# Patient Record
Sex: Male | Born: 1980 | Race: White | Hispanic: No | Marital: Married | Smoking: Current every day smoker
Health system: Southern US, Community
[De-identification: ages and names within clinical notes are randomized; demographics above are authoritative.]

## PROBLEM LIST (undated history)

## (undated) DIAGNOSIS — Z8614 Personal history of Methicillin resistant Staphylococcus aureus infection: Secondary | ICD-10-CM

## (undated) DIAGNOSIS — S62609A Fracture of unspecified phalanx of unspecified finger, initial encounter for closed fracture: Secondary | ICD-10-CM

## (undated) HISTORY — PX: ESOPHAGUS SURGERY: SHX626

## (undated) HISTORY — PX: KNEE RECONSTRUCTION: SHX5883

---

## 2015-11-19 ENCOUNTER — Emergency Department (HOSPITAL_BASED_OUTPATIENT_CLINIC_OR_DEPARTMENT_OTHER)
Admission: EM | Admit: 2015-11-19 | Discharge: 2015-11-19 | Disposition: A | Discharge status: Home / Self Care | Payer: Self-pay | Attending: Emergency Medicine | Admitting: Emergency Medicine

## 2015-11-19 ENCOUNTER — Emergency Department (HOSPITAL_BASED_OUTPATIENT_CLINIC_OR_DEPARTMENT_OTHER): Payer: Self-pay

## 2015-11-19 ENCOUNTER — Encounter (HOSPITAL_BASED_OUTPATIENT_CLINIC_OR_DEPARTMENT_OTHER): Payer: Self-pay | Admitting: *Deleted

## 2015-11-19 DIAGNOSIS — S62621A Displaced fracture of medial phalanx of left index finger, initial encounter for closed fracture: Secondary | ICD-10-CM | POA: Insufficient documentation

## 2015-11-19 DIAGNOSIS — Y9289 Other specified places as the place of occurrence of the external cause: Secondary | ICD-10-CM | POA: Insufficient documentation

## 2015-11-19 DIAGNOSIS — M20002 Unspecified deformity of left finger(s): Secondary | ICD-10-CM | POA: Insufficient documentation

## 2015-11-19 DIAGNOSIS — W228XXA Striking against or struck by other objects, initial encounter: Secondary | ICD-10-CM | POA: Insufficient documentation

## 2015-11-19 DIAGNOSIS — S62609B Fracture of unspecified phalanx of unspecified finger, initial encounter for open fracture: Secondary | ICD-10-CM

## 2015-11-19 DIAGNOSIS — F17210 Nicotine dependence, cigarettes, uncomplicated: Secondary | ICD-10-CM | POA: Insufficient documentation

## 2015-11-19 DIAGNOSIS — Y990 Civilian activity done for income or pay: Secondary | ICD-10-CM | POA: Insufficient documentation

## 2015-11-19 DIAGNOSIS — Y9389 Activity, other specified: Secondary | ICD-10-CM | POA: Insufficient documentation

## 2015-11-19 DIAGNOSIS — S62609A Fracture of unspecified phalanx of unspecified finger, initial encounter for closed fracture: Secondary | ICD-10-CM

## 2015-11-19 HISTORY — DX: Fracture of unspecified phalanx of unspecified finger, initial encounter for closed fracture: S62.609A

## 2015-11-19 MED ORDER — TRAMADOL HCL 50 MG PO TABS: 100.0000 mg | ORAL_TABLET | Freq: Four times a day (QID) | ORAL | Status: DC | PRN

## 2015-11-19 MED ORDER — BUPIVACAINE HCL (PF) 0.5 % IJ SOLN
10.0000 mL | Freq: Once | INTRAMUSCULAR | Status: AC
  Administered 2015-11-19: 10 mL
  Filled 2015-11-19: qty 10

## 2015-11-19 MED ORDER — CEFAZOLIN SODIUM 1-5 GM-% IV SOLN
1.0000 g | Freq: Once | INTRAVENOUS | Status: AC
  Administered 2015-11-19: 1 g via INTRAVENOUS
  Filled 2015-11-19: qty 50

## 2015-11-19 MED ORDER — CEPHALEXIN 500 MG PO CAPS: 1000.0000 mg | ORAL_CAPSULE | Freq: Two times a day (BID) | ORAL | Status: AC

## 2015-11-19 MED ORDER — MORPHINE SULFATE (PF) 4 MG/ML IV SOLN
4.0000 mg | Freq: Once | INTRAVENOUS | Status: AC
  Administered 2015-11-19: 4 mg via INTRAVENOUS
  Filled 2015-11-19: qty 1

## 2015-11-19 MED ORDER — IBUPROFEN 800 MG PO TABS: 800.0000 mg | ORAL_TABLET | Freq: Three times a day (TID) | ORAL | Status: AC

## 2015-11-19 NOTE — ED Notes (Signed)
[facility]

## 2015-11-19 NOTE — ED Notes (Signed)
Patient transported to xray dept. 

## 2015-11-19 NOTE — ED Notes (Signed)
Mash injury to his left 2nd digit. 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 Laceration noted. Bleeding controlled.

## 2015-11-19 NOTE — Discharge Instructions (Signed)
Finger Fracture Your finger fracture also has an open wound. Keep your dressing in place and your splint in place until you are seen by the hand surgeon for recheck. Call tomorrow morning to schedule a recheck within the next one to 4 days. Keep your hand elevated as much as possible to decrease swelling. Fractures of fingers are breaks in the bones of the fingers. There are many types of fractures. There are different ways of treating these fractures. Your health care provider will discuss the best way to treat your fracture. CAUSES Traumatic injury is the main cause of broken fingers. These include:  Injuries while playing sports.  Workplace injuries.  Falls. RISK FACTORS Activities that can increase your risk of finger fractures include:  Sports.  Workplace activities that involve machinery.  A condition called osteoporosis, which can make your bones less dense and cause them to fracture more easily. SIGNS AND SYMPTOMS The main symptoms of a broken finger are pain and swelling within 15 minutes after the injury. Other symptoms include:  Bruising of your finger.  Stiffness of your finger.  Numbness of your finger.  Exposed bones (compound fracture) if the fracture is severe. DIAGNOSIS  The best way to diagnose a broken bone is with X-ray imaging. Additionally, your health care provider will use this X-ray image to evaluate the position of the broken finger bones.  TREATMENT  Finger fractures can be treated with:   Nonreduction--This means the bones are in place. The finger is splinted without changing the positions of the bone pieces. The splint is usually left on for about a week to 10 days. This will depend on your fracture and what your health care provider thinks.  Closed reduction--The bones are put back into position without using surgery. The finger is then splinted.  Open reduction and internal fixation--The fracture site is opened. Then the bone pieces are fixed into  place with pins or some type of hardware. This is seldom required. It depends on the severity of the fracture. HOME CARE INSTRUCTIONS   Follow your health care provider's instructions regarding activities, exercises, and physical therapy.  Only take over-the-counter or prescription medicines for pain, discomfort, or fever as directed by your health care provider. SEEK MEDICAL CARE IF: You have pain or swelling that limits the motion or use of your fingers. SEEK IMMEDIATE MEDICAL CARE IF:  Your finger becomes numb. MAKE SURE YOU:   Understand these instructions.  Will watch your condition.  Will get help right away if you are not doing well or get worse.   This information is not intended to replace advice given to you by your health care provider. Make sure you discuss any questions you have with your health care provider.   Document Released: 03/05/2001 Document Revised: 09/11/2013 Document Reviewed: 07/03/2013 Elsevier Interactive Patient Education 2016 [employer] Laceration Care, Adult A laceration is a cut that goes through all of the layers of the skin and into the tissue that is right under the skin. Some lacerations heal on their own. Others need to be closed with stitches (sutures), staples, skin adhesive strips, or skin glue. Proper laceration care minimizes the risk of infection and helps the laceration to heal better. HOW TO CARE FOR YOUR LACERATION If sutures or staples were used:  Keep the wound clean and dry.  If you were given a bandage (dressing), you should change it at least one time per day or as told by your health care provider. You should also change  it if it becomes wet or dirty.  Keep the wound completely dry for the first 24 hours or as told by your health care provider. After that time, you may shower or bathe. However, make sure that the wound is not soaked in water until after the sutures or staples have been removed.  Clean the wound one time each day  or as told by your health care provider:  Wash the wound with soap and water.  Rinse the wound with water to remove all soap.  Pat the wound dry with a clean towel. Do not rub the wound.  After cleaning the wound, apply a thin layer of antibiotic ointmentas told by your health care provider. This will help to prevent infection and keep the dressing from sticking to the wound.  Have the sutures or staples removed as told by your health care provider. If skin adhesive strips were used:  Keep the wound clean and dry.  If you were given a bandage (dressing), you should change it at least one time per day or as told by your health care provider. You should also change it if it becomes dirty or wet.  Do not get the skin adhesive strips wet. You may shower or bathe, but be careful to keep the wound dry.  If the wound gets wet, pat it dry with a clean towel. Do not rub the wound.  Skin adhesive strips fall off on their own. You may trim the strips as the wound heals. Do not remove skin adhesive strips that are still stuck to the wound. They will fall off in time. If skin glue was used:  Try to keep the wound dry, but you may briefly wet it in the shower or bath. Do not soak the wound in water, such as by swimming.  After you have showered or bathed, gently pat the wound dry with a clean towel. Do not rub the wound.  Do not do any activities that will make you sweat heavily until the skin glue has fallen off on its own.  Do not apply liquid, cream, or ointment medicine to the wound while the skin glue is in place. Using those may loosen the film before the wound has healed.  If you were given a bandage (dressing), you should change it at least one time per day or as told by your health care provider. You should also change it if it becomes dirty or wet.  If a dressing is placed over the wound, be careful not to apply tape directly over the skin glue. Doing that may cause the glue to be  pulled off before the wound has healed.  Do not pick at the glue. The skin glue usually remains in place for 5-10 days, then it falls off of the skin. General Instructions  Take over-the-counter and prescription medicines only as told by your health care provider.  If you were prescribed an antibiotic medicine or ointment, take or apply it as told by your doctor. Do not stop using it even if your condition improves.  To help prevent scarring, make sure to cover your wound with sunscreen whenever you are outside after stitches are removed, after adhesive strips are removed, or when glue remains in place and the wound is healed. Make sure to wear a sunscreen of at least 30 SPF.  Do not scratch or pick at the wound.  Keep all follow-up visits as told by your health care provider. This is important.  Check  your wound every day for signs of infection. Watch for:  Redness, swelling, or pain.  Fluid, blood, or pus.  Raise (elevate) the injured area above the level of your heart while you are sitting or lying down, if possible. SEEK MEDICAL CARE IF:  You received a tetanus shot and you have swelling, severe pain, redness, or bleeding at the injection site.  You have a fever.  A wound that was closed breaks open.  You notice a bad smell coming from your wound or your dressing.  You notice something coming out of the wound, such as wood or glass.  Your pain is not controlled with medicine.  You have increased redness, swelling, or pain at the site of your wound.  You have fluid, blood, or pus coming from your wound.  You notice a change in the color of your skin near your wound.  You need to change the dressing frequently due to fluid, blood, or pus draining from the wound.  You develop a new rash.  You develop numbness around the wound. SEEK IMMEDIATE MEDICAL CARE IF:  You develop severe swelling around the wound.  Your pain suddenly increases and is severe.  You develop  painful lumps near the wound or on skin that is anywhere on your body.  You have a red streak going away from your wound.  The wound is on your hand or foot and you cannot properly move a finger or toe.  The wound is on your hand or foot and you notice that your fingers or toes look pale or bluish.   This information is not intended to replace advice given to you by your health care provider. Make sure you discuss any questions you have with your health care provider.   Document Released: 11/21/2005 Document Revised: 04/07/2015 Document Reviewed: 11/17/2014 Elsevier Interactive Patient Education [employer]

## 2015-11-19 NOTE — ED Provider Notes (Signed)
CSN: [id number]     Arrival date & time 11/19/15  1618 History   First MD Initiated Contact with Patient 11/19/15 1628     Chief Complaint  Patient presents with  . Finger Injury     (Consider location/radiation/quality/duration/timing/severity/associated sxs/prior Treatment) HPI Patient was working with another coworker trying to leverage a crowbar. The crowbar slipped and crushed his index finger on the left hand. No other associated injury. He reports there is a cut and it is very painful. He reports this difficult to move the finger. History reviewed. No pertinent past medical history. History reviewed. No pertinent past surgical history. No family history on file. Social History  Substance Use Topics  . Smoking status: Current Every Day Smoker -- 1.00 packs/day    Types: Cigarettes  . Smokeless tobacco: None  . Alcohol Use: No    Review of Systems Constitutional: No recent fevers or chills.   Allergies  Review of patient's allergies indicates no known allergies.  Home Medications   Prior to Admission medications   Medication Sig Start Date End Date Taking? Authorizing Provider  cephALEXin (KEFLEX) 500 MG capsule Take 2 capsules (1,000 mg total) by mouth 2 (two) times daily. 11/19/15   Arby BarretteMarcy Brendi Mccarroll, MD  ibuprofen (ADVIL,MOTRIN) 800 MG tablet Take 1 tablet (800 mg total) by mouth 3 (three) times daily. 11/19/15   Arby BarretteMarcy Wallis Spizzirri, MD  traMADol (ULTRAM) 50 MG tablet Take 2 tablets (100 mg total) by mouth every 6 (six) hours as needed. 11/19/15   Arby BarretteMarcy Jina Olenick, MD   BP 135/97 mmHg  Pulse 88  Temp(Src) 98.7 F (37.1 C) (Oral)  Resp 20  Ht 5\' 11"  (1.803 m)  Wt 175 lb (79.379 kg)  BMI 24.42 kg/m2  SpO2 100% Physical Exam  Constitutional: He is oriented to person, place, and time. He appears well-developed and well-nourished. No distress.  HENT:  Head: Normocephalic and atraumatic.  Eyes: EOM are normal.  Pulmonary/Chest: Effort normal.  Musculoskeletal:  Left  index finger has laceration 1.5 cm linear over the dorsal surface just superior to the PIP. No active bleeding. Moderate diffuse swelling of the digit. Patient has pre-existing atrophic retraction of the distal phalangeal segment of both the index and the third digit. There is crepitus to movement of the index finger. Remainder of the hand is normal.  Neurological: He is alert and oriented to person, place, and time. Coordination normal.  Skin: Skin is warm and dry.  Psychiatric: He has a normal mood and affect.    ED Course  Procedures (including critical care time) LACERATION REPAIR Performed by: Arby BarrettePfeiffer, Jazyiah Yiu Authorized by: Arby BarrettePfeiffer, Aidric Endicott Consent: Verbal consent obtained. Risks and benefits: risks, benefits and alternatives were discussed Consent given by: patient Patient identity confirmed: provided demographic data Prepped and Draped in normal sterile fashion Wound explored  Laceration Location: Dorsum of first index finger. Just distal to the interphalangeal joint.  Laceration Length: 1.5 cm  No Foreign Bodies seen or palpated after anesthesia, the wound was extensively cleaned with normal saline. I retracted the wound and palpated with a forceps and was not able to visualize bone or contact bone with a forceps.  Anesthesia: Digital block with 0.5% Marcaine.     Anesthetic total: 3 ml  Irrigation method: syringe Amount of cleaning: standard  Skin closure: 4 interrupted 4-0 Ethilon suture   Number of sutures: 4   Technique: Interrupted   Patient tolerance: Patient tolerated the procedure well with no immediate complications. Labs Review Labs Reviewed - No data to  display  Imaging Review Dg Finger Index Left  11/19/2015  CLINICAL DATA:  Smashed LEFT index finger between machines today, pain with open wound at distal phalanx none EXAM: LEFT INDEX FINGER 2+V COMPARISON:  None. FINDINGS: Osseous mineralization normal. Joint spaces preserved. 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 Comminuted minimally displaced fracture at base of middle phalanx LEFT index finger with questionable intra-articular extension at the PIP joint. No additional fracture, dislocation or bone destruction. IMPRESSION: Comminuted displaced fracture through base of middle phalanx LEFT index finger with questionable intra-articular extension at PIP joint. 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 Electronically Signed   By: Ulyses Southward M.D.   On: 11/19/2015 17:10   I have personally reviewed and evaluated these images and lab results as part of my medical decision-making.   EKG Interpretation None     Consult: Patient's case was reviewed with Dr. Merlyn Lot. At this time with no bone visible and no bone palpable in the laceration, he advises the wound can be closed and the patient can follow-up as an outpatient for definitive management. MDM   Final diagnoses:  Finger fracture, left, open, initial encounter   Patient has fracture of the middle phalangeal segment of the third digit. There is associated laceration. No bone was visible or palpable during wound preparation and closure. Wound was extensively cleaned with normal saline and surrounding prepped with Betadine. Some traction was applied to straighten the bony segment. Once wound was closed, this was dressed and buddy taped with splint applied. Patient will be discharged with Keflex, ibuprofen and tramadol. He is counseled on the necessity of follow-up with the hand surgeon.    Arby Barrette, MD 11/19/15 [phone]

## 2015-11-23 ENCOUNTER — Encounter (HOSPITAL_BASED_OUTPATIENT_CLINIC_OR_DEPARTMENT_OTHER): Payer: Self-pay | Admitting: *Deleted

## 2015-11-23 ENCOUNTER — Other Ambulatory Visit: Payer: Self-pay | Admitting: Orthopedic Surgery

## 2015-11-24 ENCOUNTER — Ambulatory Visit (HOSPITAL_BASED_OUTPATIENT_CLINIC_OR_DEPARTMENT_OTHER): Payer: Worker's Compensation | Admitting: Anesthesiology

## 2015-11-24 ENCOUNTER — Encounter (HOSPITAL_BASED_OUTPATIENT_CLINIC_OR_DEPARTMENT_OTHER)
Admission: RE | Disposition: A | Discharge status: Home / Self Care | Payer: Self-pay | Source: Ambulatory Visit | Attending: Orthopedic Surgery

## 2015-11-24 ENCOUNTER — Ambulatory Visit (HOSPITAL_BASED_OUTPATIENT_CLINIC_OR_DEPARTMENT_OTHER)
Admission: RE | Admit: 2015-11-24 | Discharge: 2015-11-24 | Disposition: A | Discharge status: Home / Self Care | Payer: Worker's Compensation | Source: Ambulatory Visit | Attending: Orthopedic Surgery | Admitting: Orthopedic Surgery

## 2015-11-24 ENCOUNTER — Encounter (HOSPITAL_BASED_OUTPATIENT_CLINIC_OR_DEPARTMENT_OTHER): Payer: Self-pay | Admitting: Anesthesiology

## 2015-11-24 DIAGNOSIS — S62621A Displaced fracture of medial phalanx of left index finger, initial encounter for closed fracture: Secondary | ICD-10-CM | POA: Insufficient documentation

## 2015-11-24 DIAGNOSIS — W230XXA Caught, crushed, jammed, or pinched between moving objects, initial encounter: Secondary | ICD-10-CM | POA: Insufficient documentation

## 2015-11-24 DIAGNOSIS — F17210 Nicotine dependence, cigarettes, uncomplicated: Secondary | ICD-10-CM | POA: Diagnosis not present

## 2015-11-24 DIAGNOSIS — S67191A Crushing injury of left index finger, initial encounter: Secondary | ICD-10-CM | POA: Diagnosis not present

## 2015-11-24 HISTORY — PX: CLOSED REDUCTION METACARPAL WITH PERCUTANEOUS PINNING: SHX5613

## 2015-11-24 HISTORY — DX: Personal history of Methicillin resistant Staphylococcus aureus infection: Z86.14

## 2015-11-24 HISTORY — DX: Fracture of unspecified phalanx of unspecified finger, initial encounter for closed fracture: S62.609A

## 2015-11-24 SURGERY — CLOSED REDUCTION, FRACTURE, METACARPAL BONE, WITH PERCUTANEOUS PINNING
Anesthesia: General | Site: Finger | Laterality: Left

## 2015-11-24 MED ORDER — CHLORHEXIDINE GLUCONATE 4 % EX LIQD: 60.0000 mL | Freq: Once | CUTANEOUS | Status: DC

## 2015-11-24 MED ORDER — HYDROMORPHONE HCL 1 MG/ML IJ SOLN
0.2500 mg | INTRAMUSCULAR | Status: DC | PRN
  Administered 2015-11-24 (×4): 0.5 mg via INTRAVENOUS

## 2015-11-24 MED ORDER — LACTATED RINGERS IV SOLN
INTRAVENOUS | Status: DC
  Administered 2015-11-24 (×2): via INTRAVENOUS

## 2015-11-24 MED ORDER — ONDANSETRON HCL 4 MG/2ML IJ SOLN
INTRAMUSCULAR | Status: DC | PRN
  Administered 2015-11-24: 4 mg via INTRAVENOUS

## 2015-11-24 MED ORDER — OXYCODONE HCL 5 MG PO TABS
5.0000 mg | ORAL_TABLET | Freq: Once | ORAL | Status: AC | PRN
  Administered 2015-11-24: 5 mg via ORAL

## 2015-11-24 MED ORDER — MEPERIDINE HCL 25 MG/ML IJ SOLN: 6.2500 mg | INTRAMUSCULAR | Status: DC | PRN

## 2015-11-24 MED ORDER — ONDANSETRON HCL 4 MG/2ML IJ SOLN
INTRAMUSCULAR | Status: AC
  Filled 2015-11-24: qty 2

## 2015-11-24 MED ORDER — OXYCODONE HCL 5 MG PO TABS
ORAL_TABLET | ORAL | Status: AC
  Filled 2015-11-24: qty 1

## 2015-11-24 MED ORDER — KETOROLAC TROMETHAMINE 30 MG/ML IJ SOLN
30.0000 mg | Freq: Once | INTRAMUSCULAR | Status: AC
  Administered 2015-11-24: 30 mg via INTRAVENOUS

## 2015-11-24 MED ORDER — ATROPINE SULFATE 0.4 MG/ML IJ SOLN
INTRAMUSCULAR | Status: AC
  Filled 2015-11-24: qty 1

## 2015-11-24 MED ORDER — OXYCODONE-ACETAMINOPHEN 5-325 MG PO TABS: ORAL_TABLET | ORAL | Status: AC

## 2015-11-24 MED ORDER — HYDROMORPHONE HCL 1 MG/ML IJ SOLN
INTRAMUSCULAR | Status: AC
  Filled 2015-11-24: qty 1

## 2015-11-24 MED ORDER — FENTANYL CITRATE (PF) 100 MCG/2ML IJ SOLN
50.0000 ug | INTRAMUSCULAR | Status: DC | PRN
Start: 2015-11-24 — End: 2015-11-24
  Administered 2015-11-24: 100 ug via INTRAVENOUS

## 2015-11-24 MED ORDER — EPHEDRINE SULFATE 50 MG/ML IJ SOLN
INTRAMUSCULAR | Status: AC
  Filled 2015-11-24: qty 1

## 2015-11-24 MED ORDER — LIDOCAINE HCL (CARDIAC) 20 MG/ML IV SOLN
INTRAVENOUS | Status: AC
  Filled 2015-11-24: qty 5

## 2015-11-24 MED ORDER — SCOPOLAMINE 1 MG/3DAYS TD PT72: 1.0000 | MEDICATED_PATCH | Freq: Once | TRANSDERMAL | Status: DC | PRN

## 2015-11-24 MED ORDER — PROPOFOL 10 MG/ML IV BOLUS
INTRAVENOUS | Status: AC
  Filled 2015-11-24: qty 40

## 2015-11-24 MED ORDER — BUPIVACAINE HCL (PF) 0.25 % IJ SOLN
INTRAMUSCULAR | Status: DC | PRN
  Administered 2015-11-24: 10 mL

## 2015-11-24 MED ORDER — SUCCINYLCHOLINE CHLORIDE 20 MG/ML IJ SOLN
INTRAMUSCULAR | Status: AC
  Filled 2015-11-24: qty 1

## 2015-11-24 MED ORDER — KETOROLAC TROMETHAMINE 30 MG/ML IJ SOLN
INTRAMUSCULAR | Status: AC
  Filled 2015-11-24: qty 1

## 2015-11-24 MED ORDER — MIDAZOLAM HCL 2 MG/2ML IJ SOLN
INTRAMUSCULAR | Status: AC
  Filled 2015-11-24: qty 2

## 2015-11-24 MED ORDER — GLYCOPYRROLATE 0.2 MG/ML IJ SOLN: 0.2000 mg | Freq: Once | INTRAMUSCULAR | Status: DC | PRN

## 2015-11-24 MED ORDER — GLYCOPYRROLATE 0.2 MG/ML IJ SOLN
INTRAMUSCULAR | Status: AC
  Filled 2015-11-24: qty 1

## 2015-11-24 MED ORDER — MIDAZOLAM HCL 2 MG/2ML IJ SOLN
1.0000 mg | INTRAMUSCULAR | Status: DC | PRN
  Administered 2015-11-24: 2 mg via INTRAVENOUS

## 2015-11-24 MED ORDER — FENTANYL CITRATE (PF) 100 MCG/2ML IJ SOLN
INTRAMUSCULAR | Status: AC
  Filled 2015-11-24: qty 2

## 2015-11-24 MED ORDER — DEXAMETHASONE SODIUM PHOSPHATE 10 MG/ML IJ SOLN
INTRAMUSCULAR | Status: AC
  Filled 2015-11-24: qty 1

## 2015-11-24 MED ORDER — LIDOCAINE HCL (CARDIAC) 20 MG/ML IV SOLN
INTRAVENOUS | Status: DC | PRN
  Administered 2015-11-24: 100 mg via INTRAVENOUS

## 2015-11-24 MED ORDER — OXYCODONE HCL 5 MG/5ML PO SOLN: 5.0000 mg | Freq: Once | ORAL | Status: AC | PRN

## 2015-11-24 MED ORDER — PHENYLEPHRINE HCL 10 MG/ML IJ SOLN
INTRAMUSCULAR | Status: AC
  Filled 2015-11-24: qty 1

## 2015-11-24 MED ORDER — CEFAZOLIN SODIUM-DEXTROSE 2-3 GM-% IV SOLR
2.0000 g | INTRAVENOUS | Status: AC
  Administered 2015-11-24: 2 g via INTRAVENOUS

## 2015-11-24 MED ORDER — PROPOFOL 10 MG/ML IV BOLUS
INTRAVENOUS | Status: DC | PRN
  Administered 2015-11-24: 200 mg via INTRAVENOUS

## 2015-11-24 MED ORDER — CEFAZOLIN SODIUM-DEXTROSE 2-3 GM-% IV SOLR
INTRAVENOUS | Status: AC
  Filled 2015-11-24: qty 50

## 2015-11-24 MED ORDER — BUPIVACAINE HCL (PF) 0.25 % IJ SOLN
INTRAMUSCULAR | Status: AC
  Filled 2015-11-24: qty 30

## 2015-11-24 MED ORDER — DEXAMETHASONE SODIUM PHOSPHATE 10 MG/ML IJ SOLN
INTRAMUSCULAR | Status: DC | PRN
  Administered 2015-11-24: 10 mg via INTRAVENOUS

## 2015-11-24 SURGICAL SUPPLY — 58 items
BANDAGE ELASTIC 3 VELCRO ST LF (GAUZE/BANDAGES/DRESSINGS) ×3 IMPLANT
BLADE MINI RND TIP GREEN BEAV (BLADE) IMPLANT
BLADE SURG 15 STRL LF DISP TIS (BLADE) ×2 IMPLANT
BLADE SURG 15 STRL SS (BLADE) ×4
BNDG ELASTIC 2 VLCR STRL LF (GAUZE/BANDAGES/DRESSINGS) IMPLANT
BNDG ESMARK 4X9 LF (GAUZE/BANDAGES/DRESSINGS) ×3 IMPLANT
BNDG GAUZE ELAST 4 BULKY (GAUZE/BANDAGES/DRESSINGS) ×3 IMPLANT
CHLORAPREP W/TINT 26ML (MISCELLANEOUS) ×3 IMPLANT
CORDS BIPOLAR (ELECTRODE) ×3 IMPLANT
COVER BACK TABLE 60X90IN (DRAPES) ×3 IMPLANT
COVER MAYO STAND STRL (DRAPES) ×3 IMPLANT
CUFF TOURNIQUET SINGLE 18IN (TOURNIQUET CUFF) ×3 IMPLANT
DRAPE EXTREMITY T 121X128X90 (DRAPE) ×3 IMPLANT
DRAPE OEC MINIVIEW 54X84 (DRAPES) ×3 IMPLANT
DRAPE SURG 17X23 STRL (DRAPES) ×3 IMPLANT
GAUZE SPONGE 4X4 12PLY STRL (GAUZE/BANDAGES/DRESSINGS) ×3 IMPLANT
GAUZE XEROFORM 1X8 LF (GAUZE/BANDAGES/DRESSINGS) ×3 IMPLANT
GLOVE BIO SURGEON STRL SZ7.5 (GLOVE) ×3 IMPLANT
GLOVE BIOGEL PI IND STRL 7.0 (GLOVE) ×1 IMPLANT
GLOVE BIOGEL PI IND STRL 8 (GLOVE) ×1 IMPLANT
GLOVE BIOGEL PI IND STRL 8.5 (GLOVE) ×1 IMPLANT
GLOVE BIOGEL PI INDICATOR 7.0 (GLOVE) ×2
GLOVE BIOGEL PI INDICATOR 8 (GLOVE) ×2
GLOVE BIOGEL PI INDICATOR 8.5 (GLOVE) ×2
GLOVE EXAM NITRILE EXT CUFF MD (GLOVE) ×3 IMPLANT
GLOVE SURG ORTHO 8.0 STRL STRW (GLOVE) ×3 IMPLANT
GLOVE SURG SS PI 6.5 STRL IVOR (GLOVE) ×3 IMPLANT
GOWN STRL REUS W/ TWL LRG LVL3 (GOWN DISPOSABLE) ×1 IMPLANT
GOWN STRL REUS W/ TWL XL LVL3 (GOWN DISPOSABLE) IMPLANT
GOWN STRL REUS W/TWL LRG LVL3 (GOWN DISPOSABLE) ×2
GOWN STRL REUS W/TWL XL LVL3 (GOWN DISPOSABLE) ×6 IMPLANT
K-WIRE .035X4 (WIRE) ×3 IMPLANT
NEEDLE HYPO 22GX1.5 SAFETY (NEEDLE) IMPLANT
NEEDLE HYPO 25X1 1.5 SAFETY (NEEDLE) ×3 IMPLANT
NS IRRIG 1000ML POUR BTL (IV SOLUTION) ×3 IMPLANT
PACK BASIN DAY SURGERY FS (CUSTOM PROCEDURE TRAY) ×3 IMPLANT
PAD CAST 3X4 CTTN HI CHSV (CAST SUPPLIES) ×1 IMPLANT
PAD CAST 4YDX4 CTTN HI CHSV (CAST SUPPLIES) ×1 IMPLANT
PADDING CAST ABS 4INX4YD NS (CAST SUPPLIES)
PADDING CAST ABS COTTON 4X4 ST (CAST SUPPLIES) IMPLANT
PADDING CAST COTTON 3X4 STRL (CAST SUPPLIES) ×2
PADDING CAST COTTON 4X4 STRL (CAST SUPPLIES) ×2
SLEEVE SCD COMPRESS KNEE MED (MISCELLANEOUS) IMPLANT
SPLINT PLASTER CAST XFAST 3X15 (CAST SUPPLIES) ×10 IMPLANT
SPLINT PLASTER CAST XFAST 4X15 (CAST SUPPLIES) IMPLANT
SPLINT PLASTER XTRA FAST SET 4 (CAST SUPPLIES)
SPLINT PLASTER XTRA FASTSET 3X (CAST SUPPLIES) ×20
STOCKINETTE 4X48 STRL (DRAPES) ×3 IMPLANT
SUT ETHILON 3 0 PS 1 (SUTURE) IMPLANT
SUT ETHILON 4 0 PS 2 18 (SUTURE) ×3 IMPLANT
SUT MERSILENE 4 0 P 3 (SUTURE) IMPLANT
SUT VIC AB 3-0 PS1 18 (SUTURE)
SUT VIC AB 3-0 PS1 18XBRD (SUTURE) IMPLANT
SUT VICRYL 4-0 PS2 18IN ABS (SUTURE) IMPLANT
SYR BULB 3OZ (MISCELLANEOUS) ×3 IMPLANT
SYR CONTROL 10ML LL (SYRINGE) ×3 IMPLANT
TOWEL OR 17X24 6PK STRL BLUE (TOWEL DISPOSABLE) ×6 IMPLANT
UNDERPAD 30X30 (UNDERPADS AND DIAPERS) ×3 IMPLANT

## 2015-11-24 NOTE — Anesthesia Preprocedure Evaluation (Addendum)
Anesthesia Evaluation  Patient identified by MRN, date of birth, ID band Patient awake    Reviewed: Allergy & Precautions, NPO status , Patient's Chart, lab work & pertinent test results  Airway Mallampati: I  TM Distance: >3 FB Neck ROM: Full    Dental  (+) Teeth Intact, Dental Advisory Given   Pulmonary Current Smoker,  breath sounds clear to auscultation        Cardiovascular Rhythm:Regular Rate:Normal     Neuro/Psych    GI/Hepatic   Endo/Other    Renal/GU      Musculoskeletal   Abdominal   Peds  Hematology   Anesthesia Other Findings   Reproductive/Obstetrics                             Anesthesia Physical Anesthesia Plan  ASA: I  Anesthesia Plan: General   Post-op Pain Management:    Induction: Intravenous  Airway Management Planned: LMA  Additional Equipment:   Intra-op Plan:   Post-operative Plan: Extubation in OR  Informed Consent: I have reviewed the patients History and Physical, chart, labs and discussed the procedure including the risks, benefits and alternatives for the proposed anesthesia with the patient or authorized representative who has indicated his/her understanding and acceptance.   Dental advisory given  Plan Discussed with: CRNA, Anesthesiologist and Surgeon  Anesthesia Plan Comments:         Anesthesia Quick Evaluation  

## 2015-11-24 NOTE — Transfer of Care (Signed)
Immediate Anesthesia Transfer of Care Note  Patient: Brett Ellis  Procedure(s) Performed: Procedure(s): CLOSED REDUCTION PERCUTANEOUS PINNING LEFT INDEX FINGER (Left)  Patient Location: PACU  Anesthesia Type:General  Level of Consciousness: awake  Airway & Oxygen Therapy: Patient Spontanous Breathing and Patient connected to face mask oxygen  Post-op Assessment: Report given to RN and Post -op Vital signs reviewed and stable  Post vital signs: Reviewed and stable  Last Vitals:  Filed Vitals:   11/24/15 1314  BP: 126/81  Pulse: 64  Temp: 36.5 C  Resp: 18    Complications: No apparent anesthesia complications

## 2015-11-24 NOTE — Op Note (Signed)
[id number]

## 2015-11-24 NOTE — Op Note (Signed)
Intra-operative fluoroscopic images in the AP, lateral, and oblique views were taken and evaluated by myself.  Reduction and hardware placement were confirmed.  There was no intraarticular penetration of permanent hardware.  

## 2015-11-24 NOTE — Anesthesia Procedure Notes (Signed)
Procedure Name: LMA Insertion Date/Time: 11/24/2015 3:03 PM Performed by: Caren MacadamARTER, Aryaa Bunting W Pre-anesthesia Checklist: Patient identified, Emergency Drugs available, Suction available and Patient being monitored Patient Re-evaluated:Patient Re-evaluated prior to inductionOxygen Delivery Method: Circle System Utilized Preoxygenation: Pre-oxygenation with 100% oxygen Intubation Type: IV induction Ventilation: Mask ventilation without difficulty LMA: LMA inserted LMA Size: 5.0 Number of attempts: 1 Airway Equipment and Method: Bite block Placement Confirmation: positive ETCO2 and breath sounds checked- equal and bilateral Tube secured with: Tape Dental Injury: Teeth and Oropharynx as per pre-operative assessment

## 2015-11-24 NOTE — H&P (Signed)
  Brett BombardJoshua Ellis is an 34 y.o. male.   Chief Complaint: left index finger fracture HPI: 34 yo male states he smashed left index finger between pry bar and track hoe last week.  [facility] where [facility]R revealed middle phalanx fracture.  Splinted and followed up in office.  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  Past Medical History  Diagnosis Date  . Fracture of phalanx of finger 11/19/2015    left index - crush injury; sutured 11/19/2015  . History of MRSA infection ~ 3 yrs. ago    right thigh    Past Surgical History  Procedure Laterality Date  . Knee reconstruction Left   . Esophagus surgery      age 39 weeks    History reviewed. No pertinent family history. Social History:  reports that he has been smoking Cigarettes.  He has a 17 pack-year smoking history. He has never used smokeless tobacco. He reports that he does not drink alcohol or use illicit drugs.  Allergies: No Known Allergies  No prescriptions prior to admission    No results found for this or any previous visit (from the past 48 hour(s)).  No results found.   A comprehensive review of systems was negative.  Height 5\' 11"  (1.803 m), weight 82.101 kg (181 lb).  General appearance: alert, cooperative and appears stated age Head: Normocephalic, without obvious abnormality, atraumatic Neck: supple, symmetrical, trachea midline Resp: clear to auscultation bilaterally Cardio: regular rate and rhythm GI: non tender Extremities: intact sensation adn capillary refill all digits. +epl/fpl/io.  wound on dorsum of middle phalanx with sutures in place. Pulses: 2+ and symmetric Skin: Skin color, texture, turgor normal. No rashes or lesions Neurologic: Grossly normal Incision/Wound: As above  Assessment/Plan Left index finger middle phalanx fracture.  Non operative and operative treatment options were discussed with the patient and patient wishes to proceed with  operative treatment. Risks, benefits, and alternatives of surgery were discussed and the patient agrees with the plan of care.   Brett Ellis R 11/24/2015, 12:59 PM

## 2015-11-24 NOTE — Brief Op Note (Signed)
11/24/2015  3:43 PM  PATIENT:  Brett Ellis  34 y.o. male  PRE-OPERATIVE DIAGNOSIS:  LEFT INDEX PHALANX FRACTURE   POST-OPERATIVE DIAGNOSIS:  LEFT INDEX PHALANX FRACTURE   PROCEDURE:  Procedure(s): CLOSED REDUCTION PERCUTANEOUS PINNING LEFT INDEX FINGER (Left)  SURGEON:  Surgeon(s) and Role:    * Brett LoaKevin Rokhaya Quinn, MD - Primary    * Brett SaltGary Elidia Bonenfant, MD - Assisting  PHYSICIAN ASSISTANT:   ASSISTANTS: Brett SaltGary Hilarie Sinha, MD   ANESTHESIA:   general  EBL:  Total I/O In: 1000 [I.V.:1000] Out: -   BLOOD ADMINISTERED:none  DRAINS: none   LOCAL MEDICATIONS USED:  MARCAINE     SPECIMEN:  No Specimen  DISPOSITION OF SPECIMEN:  N/A  COUNTS:  YES  TOURNIQUET:   Total Tourniquet Time Documented: Upper Arm (Left) - 20 minutes Total: Upper Arm (Left) - 20 minutes   DICTATION: .Other Dictation: Dictation Number [id number]  PLAN OF CARE: Discharge to home after PACU  PATIENT DISPOSITION:  PACU - hemodynamically stable.

## 2015-11-24 NOTE — Discharge Instructions (Addendum)

## 2015-11-24 NOTE — Anesthesia Postprocedure Evaluation (Signed)
Anesthesia Post Note  Patient: Brett Ellis  Procedure(s) Performed: Procedure(s) (LRB): CLOSED REDUCTION PERCUTANEOUS PINNING LEFT INDEX FINGER (Left)  Patient location during evaluation: PACU Anesthesia Type: General Level of consciousness: awake and alert Pain management: pain level controlled Vital Signs Assessment: post-procedure vital signs reviewed and stable Respiratory status: spontaneous breathing, nonlabored ventilation, respiratory function stable and patient connected to nasal cannula oxygen Cardiovascular status: blood pressure returned to baseline and stable Postop Assessment: no signs of nausea or vomiting Anesthetic complications: no    Last Vitals:  Filed Vitals:   11/24/15 1630 11/24/15 1654  BP: 99/67 122/72  Pulse: 56 65  Temp:  36.4 C  Resp: 13 18    Last Pain:  Filed Vitals:   11/24/15 1655  PainSc: 5                  [name] DAVID

## 2015-11-25 ENCOUNTER — Encounter (HOSPITAL_BASED_OUTPATIENT_CLINIC_OR_DEPARTMENT_OTHER): Payer: Self-pay | Admitting: Orthopedic Surgery

## 2015-11-25 NOTE — Op Note (Signed)
NAMMarland Kitchen:  Brett Ellis, Brett             ACCOUNT NO.:  [id number]  MEDICAL RECORD NO.:  [id number]  LOCATION:                               FACILITY:  [facility]  PHYSICIAN:  Betha LoaKevin Kanasia Gayman, MD        DATE OF BIRTH:  December 07, 1980  DATE OF PROCEDURE:  11/24/2015 DATE OF DISCHARGE:  11/24/2015                              OPERATIVE REPORT   PREOPERATIVE DIAGNOSIS:  Left index finger middle phalanx fracture.  POSTOPERATIVE DIAGNOSIS:  Left index finger middle phalanx fracture.  PROCEDURE:  Closed reduction percutaneous pinning left index finger middle phalanx fracture.  SURGEON:  Betha LoaKevin Jem Castro, MD  ASSISTANT:  [name], MD  ANESTHESIA:  General.  IV FLUIDS:  Per Anesthesia flow sheet.  ESTIMATED BLOOD LOSS:  Minimal.  COMPLICATIONS:  None.  SPECIMENS:  None.  TOURNIQUET TIME:  20 minutes.  DISPOSITION:  Stable to PACU.  INDICATIONS:  Brett Ellis is a 34 year old male who last week smashed his left index finger between a pry-bar and a track hoe.  [facility] where his wound was irrigated and debrided and sutured.  He was splinted and followed up in the office.  We discussed nonoperative and operative treatment options.  He elected to proceed with operative fixation.  Risks, benefits, and alternatives of surgery were discussed including risk of blood loss; infection; damage to nerves, vessels, tendons, ligaments, bone; failure of surgery; need for additional surgery; complications with wound healing, continued pain, nonunion, malunion, stiffness.  He voiced understanding of these risks and elected to proceed.  OPERATIVE COURSE:  After being identified preoperatively by myself, the patient and I agreed upon procedure and site of procedure.  Surgical site was marked.  The risks, benefits, alternatives of surgery were reviewed and he wished to proceed.  Surgical consent had been signed. He was given IV Ancef as preoperative antibiotic prophylaxis.  He  was transferred to the operating room and placed on the operating room table in supine position with left upper extremity on arm board.  General anesthesia was induced by anesthesiologist.  Left upper extremity was prepped and draped in normal sterile orthopedic fashion.  Surgical pause was performed between surgeons, anesthesia, and operating room staff, and all were in agreement as to patient, procedure, and site procedure. The sutures had been removed from the wound prior to prepping and draping.  Tourniquet at the proximal aspect of the extremity was inflated to 250 mmHg after exsanguination of the limb with Esmarch bandage.  The wound was explored.  There was no gross contamination.  It did not course deep to the extensor tendon.  This was not an open fracture.  The wound was copiously irrigated with sterile saline and later closed with 4-0 nylon in a horizontal mattress fashion.  C-arm was brought in.  It was used in AP and lateral projections throughout the case to aid in reduction and position of hardware.  A closed reduction of the middle phalanx was performed.  Two 0.035-inch K-wires were then advanced from the base of the phalanx in a crossed fashion into the distal aspect of the phalanx.  This provided good stabilization.  The pins were bent and cut  short.  The wound and pin sites were dressed with sterile Xeroform, 4x4s, and wrapped with Kerlix bandage.  A volar splint was placed and wrapped with Kerlix and Ace bandage.  Tourniquet was deflated at 20 minutes.  Fingertips were pink with brisk capillary refill after deflation of tourniquet.  Operative drapes were broken down.  The patient was awoken from anesthesia safely.  He was transferred back to stretcher and taken to PACU in stable condition.  I will see him back in the office in 1 week for postoperative followup. We will give him Percocet 5/325, 1-2 p.o. q.6 hours p.r.n. pain, dispensed #40.     Betha Loa,  MD     KK/MEDQ  D:  11/24/2015  T:  11/24/2015  Job:  [id number]

## 2017-02-10 IMAGING — DX DG FINGER INDEX 2+V*L*
3 series · 3 of 3 positions shown · non-contrast
Comparison: None.

CLINICAL DATA: Smashed LEFT index finger between machines today,
pain with open wound at distal phalanx none

EXAM:
LEFT INDEX FINGER 2+V

[finger ap]
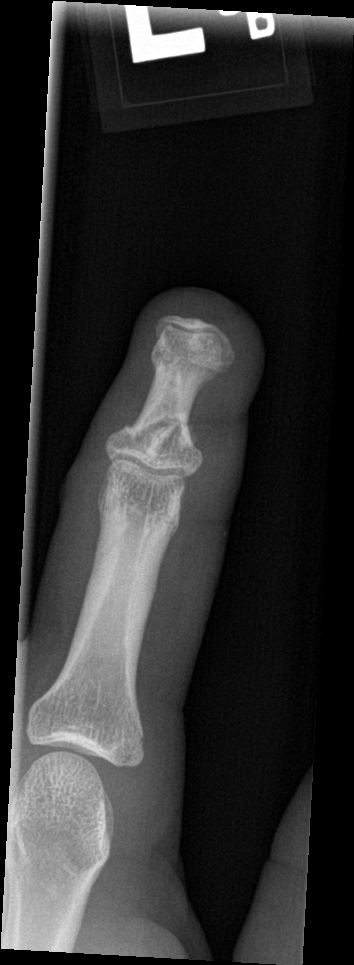

[finger obl]
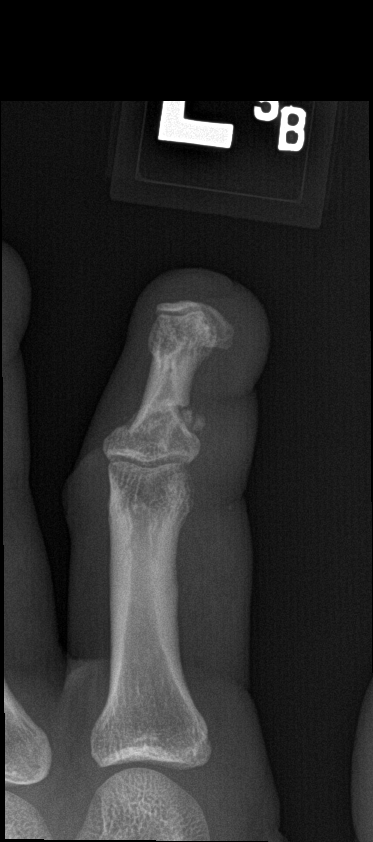

[finger lat]
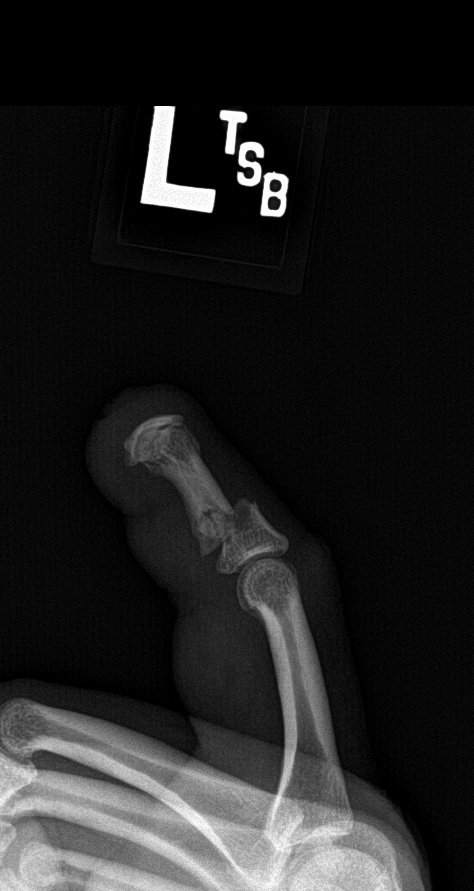

[3 of 3 positions shown; findings below may reference images not displayed]

FINDINGS: Osseous mineralization normal.

Joint spaces preserved.

Prior amputation of LEFT index finger through the base of the distal
phalanx.

Comminuted minimally displaced fracture at base of middle phalanx
LEFT index finger with questionable intra-articular extension at the
PIP joint.

No additional fracture, dislocation or bone destruction.
IMPRESSION: Comminuted displaced fracture through base of middle phalanx LEFT
index finger with questionable intra-articular extension at PIP
joint.

Prior amputation of LEFT index finger through base of distal
phalanx.
